# Patient Record
Sex: Female | Born: 1955 | Race: White | Hispanic: No | Marital: Married | Smoking: Former smoker
Health system: Southern US, Community
[De-identification: ages and names within clinical notes are randomized; demographics above are authoritative.]

## PROBLEM LIST (undated history)

## (undated) DIAGNOSIS — E785 Hyperlipidemia, unspecified: Secondary | ICD-10-CM

## (undated) DIAGNOSIS — E079 Disorder of thyroid, unspecified: Secondary | ICD-10-CM

## (undated) DIAGNOSIS — C801 Malignant (primary) neoplasm, unspecified: Secondary | ICD-10-CM

## (undated) DIAGNOSIS — I10 Essential (primary) hypertension: Secondary | ICD-10-CM

## (undated) HISTORY — PX: BUNIONECTOMY: SHX129

## (undated) HISTORY — PX: TUBAL LIGATION: SHX77

---

## 2005-11-02 ENCOUNTER — Ambulatory Visit: Payer: Self-pay | Admitting: Internal Medicine

## 2007-10-26 ENCOUNTER — Ambulatory Visit: Payer: Self-pay

## 2007-12-02 ENCOUNTER — Ambulatory Visit: Payer: Self-pay | Admitting: *Deleted

## 2008-01-10 ENCOUNTER — Ambulatory Visit: Payer: Self-pay | Admitting: Gastroenterology

## 2009-07-01 ENCOUNTER — Ambulatory Visit: Payer: Self-pay | Admitting: Nurse Practitioner

## 2009-12-28 ENCOUNTER — Ambulatory Visit: Payer: Self-pay | Admitting: Family Medicine

## 2010-09-27 ENCOUNTER — Ambulatory Visit: Payer: Self-pay | Admitting: Family Medicine

## 2011-10-03 ENCOUNTER — Ambulatory Visit: Payer: Self-pay | Admitting: Family Medicine

## 2012-10-03 ENCOUNTER — Ambulatory Visit: Payer: Self-pay | Admitting: Internal Medicine

## 2013-04-06 ENCOUNTER — Emergency Department: Payer: Self-pay | Admitting: Emergency Medicine

## 2013-09-22 ENCOUNTER — Ambulatory Visit: Payer: Self-pay

## 2013-09-22 LAB — RAPID STREP-A WITH REFLX: Micro Text Report: NEGATIVE

## 2013-10-07 ENCOUNTER — Ambulatory Visit: Payer: Self-pay | Admitting: Internal Medicine

## 2014-12-16 ENCOUNTER — Ambulatory Visit: Payer: Self-pay | Admitting: Internal Medicine

## 2015-10-21 ENCOUNTER — Other Ambulatory Visit: Payer: Self-pay | Admitting: Internal Medicine

## 2015-10-21 DIAGNOSIS — Z1231 Encounter for screening mammogram for malignant neoplasm of breast: Secondary | ICD-10-CM

## 2015-12-21 ENCOUNTER — Ambulatory Visit
Admission: RE | Admit: 2015-12-21 | Discharge: 2015-12-21 | Disposition: A | Discharge status: Home / Self Care | Payer: Commercial Managed Care - PPO | Source: Ambulatory Visit | Attending: Internal Medicine | Admitting: Internal Medicine

## 2015-12-21 DIAGNOSIS — Z1231 Encounter for screening mammogram for malignant neoplasm of breast: Secondary | ICD-10-CM | POA: Diagnosis not present

## 2017-01-03 ENCOUNTER — Other Ambulatory Visit: Payer: Self-pay | Admitting: Internal Medicine

## 2017-01-03 DIAGNOSIS — Z1231 Encounter for screening mammogram for malignant neoplasm of breast: Secondary | ICD-10-CM

## 2017-01-08 ENCOUNTER — Ambulatory Visit
Admission: RE | Admit: 2017-01-08 | Discharge: 2017-01-08 | Disposition: A | Discharge status: Home / Self Care | Payer: Commercial Managed Care - PPO | Source: Ambulatory Visit | Attending: Internal Medicine | Admitting: Internal Medicine

## 2017-01-08 DIAGNOSIS — Z1231 Encounter for screening mammogram for malignant neoplasm of breast: Secondary | ICD-10-CM | POA: Insufficient documentation

## 2017-12-12 ENCOUNTER — Other Ambulatory Visit: Payer: Self-pay | Admitting: Internal Medicine

## 2017-12-12 DIAGNOSIS — Z1231 Encounter for screening mammogram for malignant neoplasm of breast: Secondary | ICD-10-CM

## 2018-01-09 ENCOUNTER — Ambulatory Visit
Admission: RE | Admit: 2018-01-09 | Discharge: 2018-01-09 | Disposition: A | Discharge status: Home / Self Care | Payer: Commercial Managed Care - PPO | Source: Ambulatory Visit | Attending: Internal Medicine | Admitting: Internal Medicine

## 2018-01-09 DIAGNOSIS — Z1231 Encounter for screening mammogram for malignant neoplasm of breast: Secondary | ICD-10-CM | POA: Insufficient documentation

## 2018-01-09 HISTORY — DX: Malignant (primary) neoplasm, unspecified: C80.1

## 2018-12-25 ENCOUNTER — Other Ambulatory Visit: Payer: Self-pay | Admitting: Internal Medicine

## 2018-12-25 DIAGNOSIS — Z1231 Encounter for screening mammogram for malignant neoplasm of breast: Secondary | ICD-10-CM

## 2019-01-13 ENCOUNTER — Ambulatory Visit: Payer: Commercial Managed Care - PPO

## 2019-01-16 ENCOUNTER — Ambulatory Visit
Admission: RE | Admit: 2019-01-16 | Discharge: 2019-01-16 | Disposition: A | Discharge status: Home / Self Care | Payer: Commercial Managed Care - PPO | Source: Ambulatory Visit | Attending: Internal Medicine | Admitting: Internal Medicine

## 2019-01-16 DIAGNOSIS — Z1231 Encounter for screening mammogram for malignant neoplasm of breast: Secondary | ICD-10-CM | POA: Diagnosis not present

## 2019-12-24 ENCOUNTER — Other Ambulatory Visit: Payer: Self-pay | Admitting: Internal Medicine

## 2019-12-24 DIAGNOSIS — Z1231 Encounter for screening mammogram for malignant neoplasm of breast: Secondary | ICD-10-CM

## 2020-01-20 ENCOUNTER — Other Ambulatory Visit: Payer: Self-pay

## 2020-01-20 ENCOUNTER — Ambulatory Visit
Admission: RE | Admit: 2020-01-20 | Discharge: 2020-01-20 | Disposition: A | Discharge status: Home / Self Care | Payer: Commercial Managed Care - PPO | Source: Ambulatory Visit | Attending: Internal Medicine | Admitting: Internal Medicine

## 2020-01-20 ENCOUNTER — Ambulatory Visit: Payer: Commercial Managed Care - PPO

## 2020-01-20 DIAGNOSIS — Z1231 Encounter for screening mammogram for malignant neoplasm of breast: Secondary | ICD-10-CM | POA: Diagnosis present

## 2020-06-24 ENCOUNTER — Ambulatory Visit
Admission: EM | Admit: 2020-06-24 | Discharge: 2020-06-24 | Disposition: A | Discharge status: Home / Self Care | Payer: Commercial Managed Care - PPO

## 2020-06-24 ENCOUNTER — Other Ambulatory Visit: Payer: Self-pay

## 2020-06-24 ENCOUNTER — Ambulatory Visit (INDEPENDENT_AMBULATORY_CARE_PROVIDER_SITE_OTHER): Payer: Commercial Managed Care - PPO

## 2020-06-24 ENCOUNTER — Encounter: Payer: Self-pay | Admitting: Internal Medicine

## 2020-06-24 DIAGNOSIS — S90112A Contusion of left great toe without damage to nail, initial encounter: Secondary | ICD-10-CM | POA: Diagnosis not present

## 2020-06-24 HISTORY — DX: Disorder of thyroid, unspecified: E07.9

## 2020-06-24 HISTORY — DX: Essential (primary) hypertension: I10

## 2020-06-24 HISTORY — DX: Hyperlipidemia, unspecified: E78.5

## 2020-06-24 NOTE — ED Triage Notes (Signed)
Patient states that around 730pm last night she was walking up brick stairs and didn't lift her foot high enough and hit her toe on the step. States that pain has been constant and she is concerned for fracture.

## 2020-06-24 NOTE — Discharge Instructions (Signed)
Use ice for 48 h 15 minutes at  a time 3-4 times  per day and elevate foot when resting. Make sure as the swelling goes down you are able to flex your toe just like the other side, to make sure your tendon is normal, I you cant flex it in 7-10 days or sooner, then you need to follow up with orthopedics

## 2020-06-24 NOTE — ED Provider Notes (Signed)
[facility]    CSN: [id number] Arrival date & time: 06/24/20  0802      History   Chief Complaint Chief Complaint  Patient presents with  . Toe Pain    left big toe    HPI Theresa Kelly is a 64 y.o. female. who presents with L great toe pain. Last pm she was walking up steps on a brick steps and she hit the tip of her L great toe and she felt it jammed it in the center. She did not think much of it, but was awaken in the middle of the night with pain.     Past Medical History:  Diagnosis Date  . Cancer ([place])    basal cell  . Hyperlipidemia   . Hypertension   . Thyroid disease     There are no problems to display for this patient.   Past Surgical History:  Procedure Laterality Date  . TUBAL LIGATION      OB History   No obstetric history on file.      Home Medications    Prior to Admission medications   Medication Sig Start Date End Date Taking? Authorizing Provider  aspirin 81 MG EC tablet Take by mouth.   Yes [provider]  atorvastatin (LIPITOR) 20 MG tablet Take 20 mg by mouth daily. 04/17/20  Yes [provider]  hydrochlorothiazide (HYDRODIURIL) 25 MG tablet Take 25 mg by mouth daily. 06/01/20  Yes [provider]  SYNTHROID 100 MCG tablet Take 100 mcg by mouth daily. 05/07/20  Yes [provider]    Family History Family History  Problem Relation Age of Onset  . Breast cancer Sister        mat half sister    Social History Social History   Tobacco Use  . Smoking status: Never Smoker  . Smokeless tobacco: Never Used  Vaping Use  . Vaping Use: Never used  Substance Use Topics  . Alcohol use: Yes    Comment: wine weekly  . Drug use: Not Currently     Allergies   Patient has no known allergies.   Review of Systems Review of Systems  Musculoskeletal: Positive for arthralgias and joint swelling. Negative for gait problem.  Skin: Positive for color change. Negative for rash and  wound.  Neurological: Negative for numbness.     Physical Exam Triage Vital Signs ED Triage Vitals  Enc Vitals Group     BP 06/24/20 0814 136/81     Pulse Rate 06/24/20 0814 93     Resp 06/24/20 0814 18     Temp 06/24/20 0814 98.4 F (36.9 C)     Temp Source 06/24/20 0814 Oral     SpO2 06/24/20 0814 96 %     Weight 06/24/20 0812 200 lb (90.7 kg)     Height 06/24/20 0812 5\' 3"  (1.6 m)     Head Circumference --      Peak Flow --      Pain Score 06/24/20 0812 6     Pain Loc --      Pain Edu? --      Excl. in [place]? --    No data found.  Updated Vital Signs BP 136/81 (BP Location: Left Arm)   Pulse 93   Temp 98.4 F (36.9 C) (Oral)   Resp 18   Ht 5\' 3"  (1.6 m)   Wt 200 lb (90.7 kg)   SpO2 96%   BMI 35.43 kg/m  Visual Acuity Right Eye Distance:   Left Eye Distance:   Bilateral Distance:    Right Eye Near:   Left Eye Near:    Bilateral Near:     Physical Exam Vitals and nursing note reviewed.  Constitutional:      General: She is not in acute distress.    Appearance: She is obese. She is not toxic-appearing.  HENT:     Head: Atraumatic.     Right Ear: External ear normal.     Left Ear: External ear normal.  Eyes:     General: No scleral icterus.    Conjunctiva/sclera: Conjunctivae normal.  Pulmonary:     Effort: Pulmonary effort is normal.  Musculoskeletal:        General: No deformity.     Cervical back: Neck supple.     Right lower leg: No edema.     Left lower leg: No edema.     Comments: L FOOT- has ecchymosis on the mid and proximal joint area with swelling and tenderness. Has decreased ROM due to pain. +2/4 pedal pulses. No tenderness present on the rest of her toes and foot   Skin:    General: Skin is warm and dry.  Neurological:     Mental Status: She is alert and oriented to person, place, and time.     Sensory: No sensory deficit.     Gait: Gait normal.  Psychiatric:        Mood and Affect: Mood normal.        Behavior: Behavior normal.         Thought Content: Thought content normal.        Judgment: Judgment normal.      UC Treatments / Results  Labs (all labs ordered are listed, but only abnormal results are displayed) Labs Reviewed - No data to display  EKG   Radiology DG Toe Great Left  Result Date: 06/24/2020 CLINICAL DATA:  Stubbed toe on a brick climbing onto porch last night, bruising and pain at DIP joint region EXAM: LEFT GREAT TOE COMPARISON:  None FINDINGS: Osseous demineralization. K-wire at distal first metatarsal. Degenerative changes first MTP joint. IP joint space preserved. No definite fracture, dislocation, or bone destruction. IMPRESSION: Osseous demineralization with degenerative changes of first MTP joint and prior distal first metatarsal osteotomy. No acute osseous abnormalities. Electronically Signed   By: Lavonia Dana M.D.   On: 06/24/2020 08:37    Procedures Procedures (including critical care time)  Medications Ordered in UC Medications - No data to display  Initial Impression / Assessment and Plan / UC Course  I have reviewed the triage vital signs and the nursing notes. Has contusion of L great toe.  Pertinent  imaging results that were available during my care of the patient were reviewed by me and considered in my medical decision making (see chart for details). See instructions  For care.   Final Clinical Impressions(s) / UC Diagnoses   Final diagnoses:  Contusion of left great toe without damage to nail, initial encounter     Discharge Instructions     Use ice for 48 h 15 minutes at  a time 3-4 times  per day and elevate foot when resting. Make sure as the swelling goes down you are able to flex your toe just like the other side, to make sure your tendon is normal, I you cant flex it in 7-10 days or sooner, then you need to follow up with orthopedics  ED Prescriptions    None     PDMP not reviewed this encounter.   Shelby Mattocks, [place] 06/24/20  [phone]

## 2021-05-12 ENCOUNTER — Other Ambulatory Visit: Payer: Self-pay | Admitting: Internal Medicine

## 2021-05-12 DIAGNOSIS — Z1231 Encounter for screening mammogram for malignant neoplasm of breast: Secondary | ICD-10-CM

## 2021-05-31 IMAGING — CR DG TOE GREAT 2+V*L*
3 series · 3 of 3 positions shown · non-contrast
Comparison: None

CLINICAL DATA: Stubbed toe on a brick climbing onto porch last
night, bruising and pain at DIP joint region

EXAM:
LEFT GREAT TOE

[toe ap]
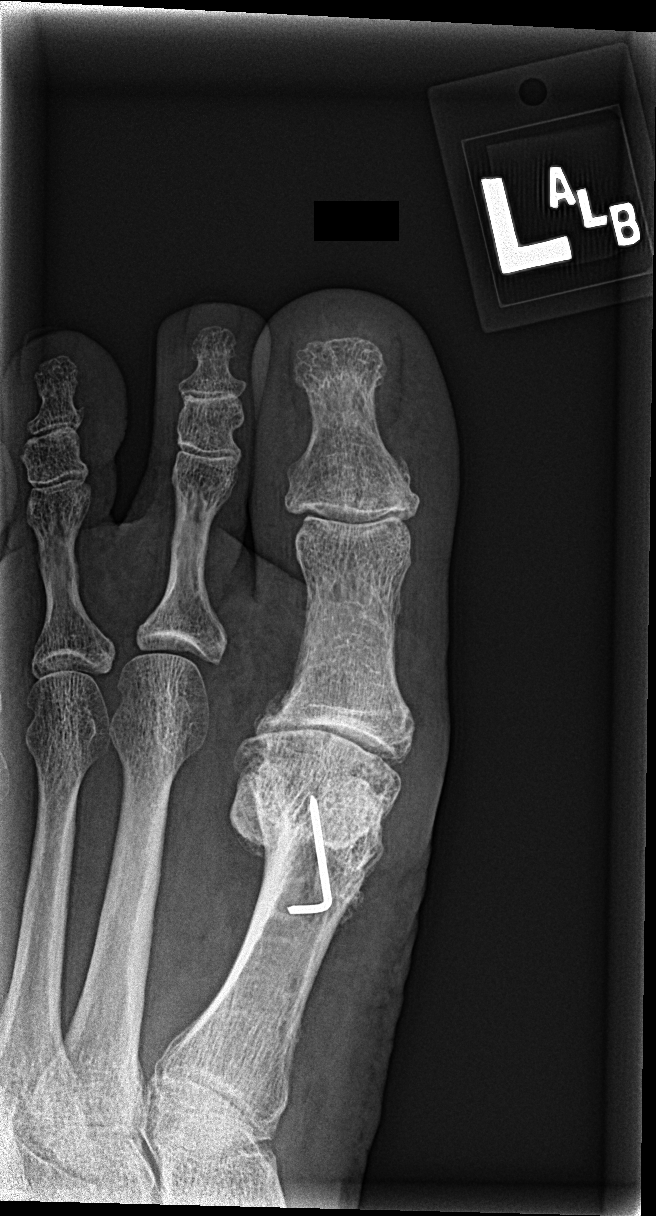

[toe obl]
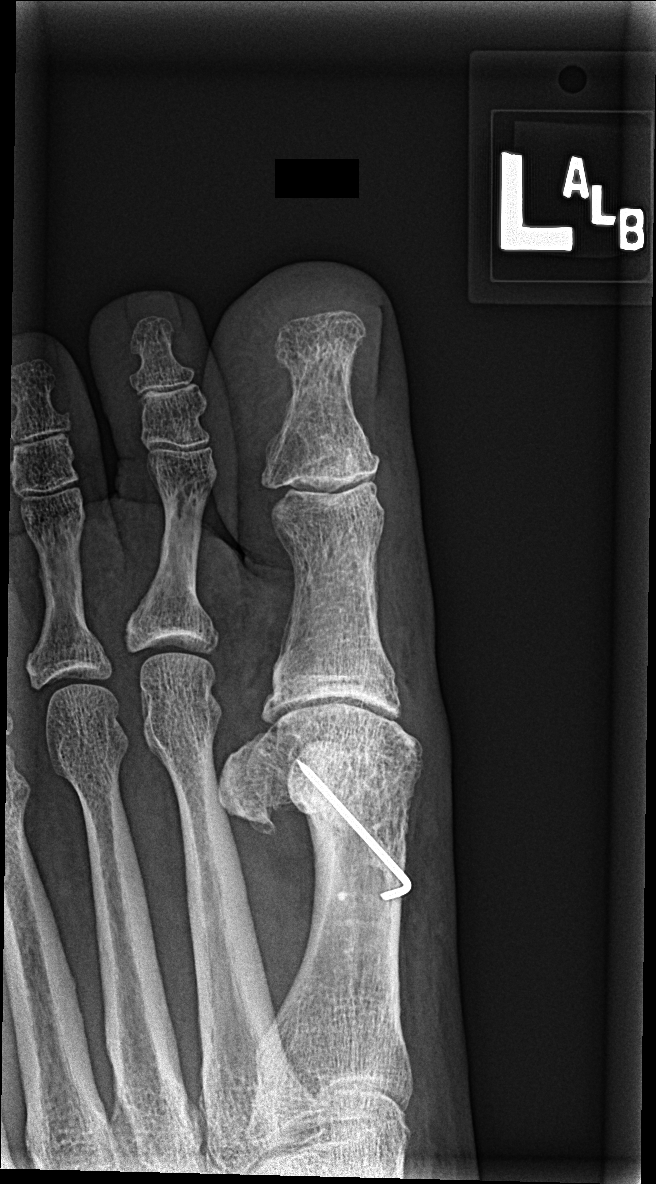

[toe lat]
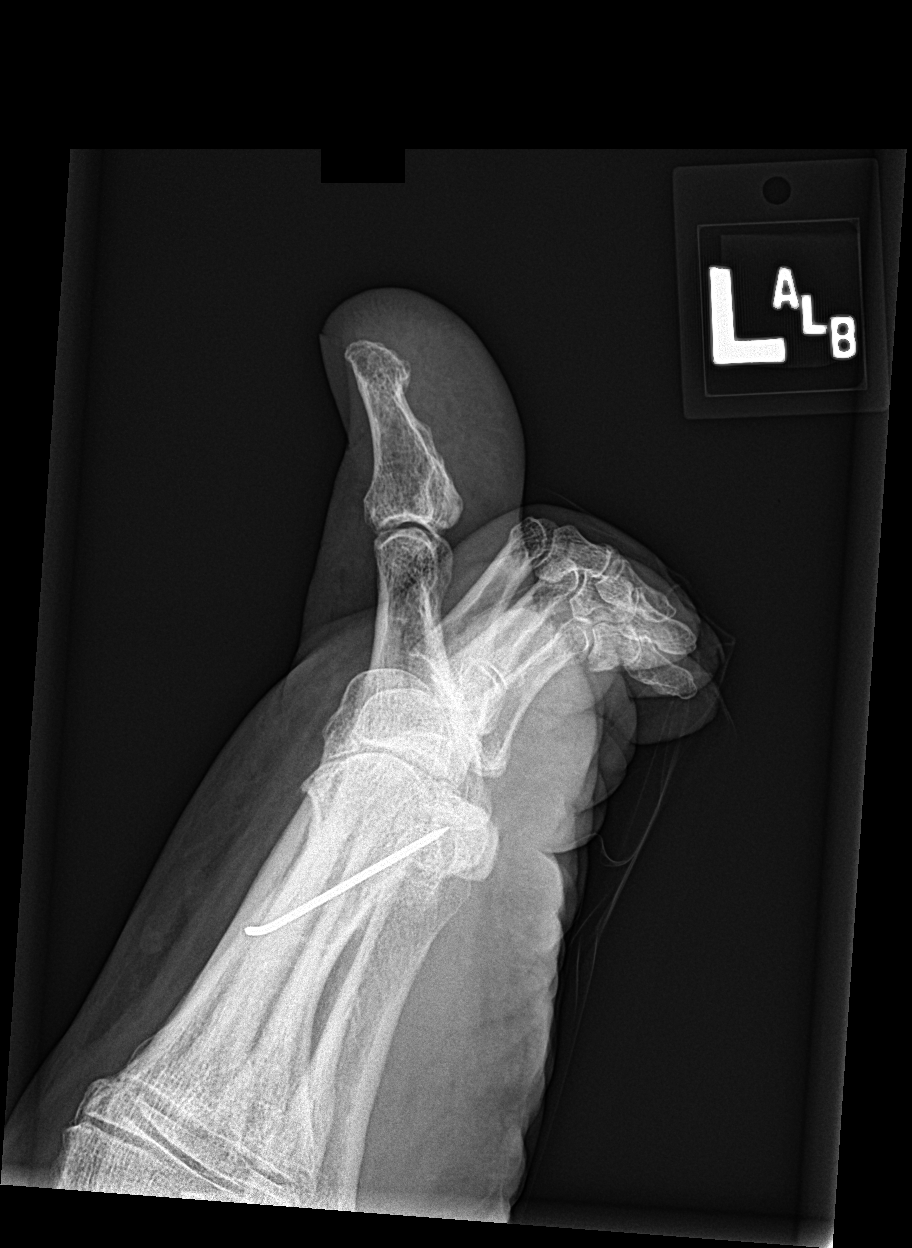

[3 of 3 positions shown; findings below may reference images not displayed]

FINDINGS: Osseous demineralization.

K-wire at distal first metatarsal.

Degenerative changes first MTP joint.

IP joint space preserved.

No definite fracture, dislocation, or bone destruction.
IMPRESSION: Osseous demineralization with degenerative changes of first MTP
joint and prior distal first metatarsal osteotomy.

No acute osseous abnormalities.

## 2021-06-22 ENCOUNTER — Ambulatory Visit
Admission: RE | Admit: 2021-06-22 | Discharge: 2021-06-22 | Disposition: A | Discharge status: Home / Self Care | Payer: Medicare Other | Source: Ambulatory Visit | Attending: Internal Medicine | Admitting: Internal Medicine

## 2021-06-22 ENCOUNTER — Other Ambulatory Visit: Payer: Self-pay

## 2021-06-22 DIAGNOSIS — Z1231 Encounter for screening mammogram for malignant neoplasm of breast: Secondary | ICD-10-CM | POA: Diagnosis present

## 2022-03-02 DIAGNOSIS — H52223 Regular astigmatism, bilateral: Secondary | ICD-10-CM | POA: Diagnosis not present

## 2022-03-02 DIAGNOSIS — H11002 Unspecified pterygium of left eye: Secondary | ICD-10-CM | POA: Diagnosis not present

## 2022-03-02 DIAGNOSIS — H524 Presbyopia: Secondary | ICD-10-CM | POA: Diagnosis not present

## 2022-03-02 DIAGNOSIS — H2513 Age-related nuclear cataract, bilateral: Secondary | ICD-10-CM | POA: Diagnosis not present

## 2022-03-02 DIAGNOSIS — H35362 Drusen (degenerative) of macula, left eye: Secondary | ICD-10-CM | POA: Diagnosis not present

## 2022-03-02 DIAGNOSIS — H5213 Myopia, bilateral: Secondary | ICD-10-CM | POA: Diagnosis not present

## 2022-05-25 DIAGNOSIS — H2513 Age-related nuclear cataract, bilateral: Secondary | ICD-10-CM | POA: Diagnosis not present

## 2022-05-31 ENCOUNTER — Other Ambulatory Visit: Payer: Self-pay | Admitting: Internal Medicine

## 2022-05-31 DIAGNOSIS — Z0000 Encounter for general adult medical examination without abnormal findings: Secondary | ICD-10-CM | POA: Diagnosis not present

## 2022-05-31 DIAGNOSIS — R739 Hyperglycemia, unspecified: Secondary | ICD-10-CM | POA: Diagnosis not present

## 2022-05-31 DIAGNOSIS — Z1231 Encounter for screening mammogram for malignant neoplasm of breast: Secondary | ICD-10-CM

## 2022-05-31 DIAGNOSIS — Z1211 Encounter for screening for malignant neoplasm of colon: Secondary | ICD-10-CM | POA: Diagnosis not present

## 2022-05-31 DIAGNOSIS — M79642 Pain in left hand: Secondary | ICD-10-CM | POA: Diagnosis not present

## 2022-05-31 DIAGNOSIS — E039 Hypothyroidism, unspecified: Secondary | ICD-10-CM | POA: Diagnosis not present

## 2022-05-31 DIAGNOSIS — E559 Vitamin D deficiency, unspecified: Secondary | ICD-10-CM | POA: Diagnosis not present

## 2022-05-31 DIAGNOSIS — I10 Essential (primary) hypertension: Secondary | ICD-10-CM | POA: Diagnosis not present

## 2022-05-31 DIAGNOSIS — E782 Mixed hyperlipidemia: Secondary | ICD-10-CM | POA: Diagnosis not present

## 2022-05-31 DIAGNOSIS — Z79899 Other long term (current) drug therapy: Secondary | ICD-10-CM | POA: Diagnosis not present

## 2022-06-19 DIAGNOSIS — M65342 Trigger finger, left ring finger: Secondary | ICD-10-CM | POA: Diagnosis not present

## 2022-06-26 ENCOUNTER — Ambulatory Visit
Admission: RE | Admit: 2022-06-26 | Discharge: 2022-06-26 | Disposition: A | Discharge status: Home / Self Care | Payer: No Typology Code available for payment source | Source: Ambulatory Visit | Attending: Internal Medicine | Admitting: Internal Medicine

## 2022-06-26 DIAGNOSIS — Z1231 Encounter for screening mammogram for malignant neoplasm of breast: Secondary | ICD-10-CM | POA: Diagnosis not present

## 2022-07-12 DIAGNOSIS — H2511 Age-related nuclear cataract, right eye: Secondary | ICD-10-CM | POA: Diagnosis not present

## 2022-07-18 ENCOUNTER — Encounter: Payer: Self-pay | Admitting: Ophthalmology

## 2022-07-18 NOTE — Anesthesia Preprocedure Evaluation (Addendum)
Anesthesia Evaluation  Patient identified by MRN, date of birth, ID band Patient awake    Reviewed: Allergy & Precautions, NPO status , Patient's Chart, lab work & pertinent test results  Airway Mallampati: III  TM Distance: >3 FB Neck ROM: full    Dental no notable dental hx.    Pulmonary former smoker,    Pulmonary exam normal        Cardiovascular hypertension, Pt. on medications Normal cardiovascular exam     Neuro/Psych negative neurological ROS  negative psych ROS   GI/Hepatic negative GI ROS, Neg liver ROS,   Endo/Other  Hypothyroidism   Renal/GU negative Renal ROS  negative genitourinary   Musculoskeletal   Abdominal (+) + obese,   Peds  Hematology negative hematology ROS (+)   Anesthesia Other Findings Past Medical History: No date: Cancer ([place])     Comment:  basal cell No date: Hyperlipidemia No date: Hypertension No date: Thyroid disease  Past Surgical History: No date: TUBAL LIGATION     Reproductive/Obstetrics negative OB ROS                            Anesthesia Physical Anesthesia Plan  ASA: 2  Anesthesia Plan: MAC   Post-op Pain Management: Minimal or no pain anticipated   Induction: Intravenous  PONV Risk Score and Plan: Treatment may vary due to age or medical condition  Airway Management Planned: Natural Airway  Additional Equipment:   Intra-op Plan:   Post-operative Plan:   Informed Consent: I have reviewed the patients History and Physical, chart, labs and discussed the procedure including the risks, benefits and alternatives for the proposed anesthesia with the patient or authorized representative who has indicated his/her understanding and acceptance.     Dental advisory given  Plan Discussed with: Anesthesiologist, CRNA and Surgeon  Anesthesia Plan Comments:        Anesthesia Quick Evaluation

## 2022-07-25 NOTE — Discharge Instructions (Signed)

## 2022-07-27 ENCOUNTER — Ambulatory Visit (AMBULATORY_SURGERY_CENTER): Payer: No Typology Code available for payment source | Admitting: Anesthesiology

## 2022-07-27 ENCOUNTER — Encounter
Admission: RE | Disposition: A | Discharge status: Home / Self Care | Payer: Self-pay | Source: Home / Self Care | Attending: Ophthalmology

## 2022-07-27 ENCOUNTER — Other Ambulatory Visit: Payer: Self-pay

## 2022-07-27 ENCOUNTER — Encounter: Payer: Self-pay | Admitting: Ophthalmology

## 2022-07-27 ENCOUNTER — Ambulatory Visit: Payer: No Typology Code available for payment source | Admitting: Anesthesiology

## 2022-07-27 ENCOUNTER — Ambulatory Visit
Admission: RE | Admit: 2022-07-27 | Discharge: 2022-07-27 | Disposition: A | Discharge status: Home / Self Care | Payer: No Typology Code available for payment source | Attending: Ophthalmology | Admitting: Ophthalmology

## 2022-07-27 DIAGNOSIS — Z87891 Personal history of nicotine dependence: Secondary | ICD-10-CM | POA: Insufficient documentation

## 2022-07-27 DIAGNOSIS — E039 Hypothyroidism, unspecified: Secondary | ICD-10-CM | POA: Diagnosis not present

## 2022-07-27 DIAGNOSIS — E669 Obesity, unspecified: Secondary | ICD-10-CM | POA: Diagnosis not present

## 2022-07-27 DIAGNOSIS — H2511 Age-related nuclear cataract, right eye: Secondary | ICD-10-CM | POA: Insufficient documentation

## 2022-07-27 DIAGNOSIS — Z6836 Body mass index (BMI) 36.0-36.9, adult: Secondary | ICD-10-CM | POA: Diagnosis not present

## 2022-07-27 DIAGNOSIS — I10 Essential (primary) hypertension: Secondary | ICD-10-CM | POA: Insufficient documentation

## 2022-07-27 HISTORY — PX: CATARACT EXTRACTION W/PHACO: SHX586

## 2022-07-27 SURGERY — PHACOEMULSIFICATION, CATARACT, WITH IOL INSERTION
Anesthesia: Monitor Anesthesia Care | Site: Eye | Laterality: Right

## 2022-07-27 MED ORDER — ARMC OPHTHALMIC DILATING DROPS
1.0000 | OPHTHALMIC | Status: DC | PRN
  Administered 2022-07-27 (×3): 1 via OPHTHALMIC

## 2022-07-27 MED ORDER — TIMOLOL MALEATE 0.5 % OP SOLN
OPHTHALMIC | Status: DC | PRN
  Administered 2022-07-27: 1 [drp] via OPHTHALMIC

## 2022-07-27 MED ORDER — MIDAZOLAM HCL 2 MG/2ML IJ SOLN
INTRAMUSCULAR | Status: DC | PRN
  Administered 2022-07-27: 1 mg via INTRAVENOUS

## 2022-07-27 MED ORDER — LIDOCAINE HCL (PF) 2 % IJ SOLN
INTRAOCULAR | Status: DC | PRN
  Administered 2022-07-27: 4 mL via INTRAOCULAR

## 2022-07-27 MED ORDER — TETRACAINE 0.5 % OP SOLN OPTIME - NO CHARGE
OPHTHALMIC | Status: DC | PRN
  Administered 2022-07-27: 1 [drp] via OPHTHALMIC

## 2022-07-27 MED ORDER — BRIMONIDINE TARTRATE 0.2 % OP SOLN
OPHTHALMIC | Status: DC | PRN
  Administered 2022-07-27: 1 [drp] via OPHTHALMIC

## 2022-07-27 MED ORDER — FENTANYL CITRATE (PF) 100 MCG/2ML IJ SOLN
INTRAMUSCULAR | Status: DC | PRN
  Administered 2022-07-27: 25 ug via INTRAVENOUS
  Administered 2022-07-27: 50 ug via INTRAVENOUS

## 2022-07-27 MED ORDER — SIGHTPATH DOSE#1 BSS IO SOLN
INTRAOCULAR | Status: DC | PRN
  Administered 2022-07-27: 15 mL via INTRAOCULAR

## 2022-07-27 MED ORDER — SIGHTPATH DOSE#1 BSS IO SOLN
INTRAOCULAR | Status: DC | PRN
  Administered 2022-07-27: 73 mL via OPHTHALMIC

## 2022-07-27 MED ORDER — TETRACAINE HCL 0.5 % OP SOLN
1.0000 [drp] | OPHTHALMIC | Status: DC | PRN
  Administered 2022-07-27 (×3): 1 [drp] via OPHTHALMIC

## 2022-07-27 MED ORDER — SIGHTPATH DOSE#1 NA HYALUR & NA CHOND-NA HYALUR IO KIT
PACK | INTRAOCULAR | Status: DC | PRN
  Administered 2022-07-27: 1 via OPHTHALMIC

## 2022-07-27 MED ORDER — MOXIFLOXACIN HCL 0.5 % OP SOLN
OPHTHALMIC | Status: DC | PRN
  Administered 2022-07-27: 0.2 mL via OPHTHALMIC

## 2022-07-27 SURGICAL SUPPLY — 23 items
CANNULA ANT/CHMB 27G (MISCELLANEOUS) IMPLANT
CANNULA ANT/CHMB 27GA (MISCELLANEOUS) IMPLANT
CATARACT SUITE SIGHTPATH (MISCELLANEOUS) ×1 IMPLANT
DISSECTOR HYDRO NUCLEUS 50X22 (MISCELLANEOUS) ×1 IMPLANT
DRSG TEGADERM 2-3/8X2-3/4 SM (GAUZE/BANDAGES/DRESSINGS) ×1 IMPLANT
FEE CATARACT SUITE SIGHTPATH (MISCELLANEOUS) ×1 IMPLANT
GLOVE SURG SYN 7.5  E (GLOVE) ×1
GLOVE SURG SYN 7.5 E (GLOVE) ×1 IMPLANT
GLOVE SURG SYN 7.5 PF PI (GLOVE) ×1 IMPLANT
GLOVE SURG SYN 8.5  E (GLOVE) ×1
GLOVE SURG SYN 8.5 E (GLOVE) ×1 IMPLANT
GLOVE SURG SYN 8.5 PF PI (GLOVE) ×1 IMPLANT
NDL FILTER BLUNT 18X1 1/2 (NEEDLE) IMPLANT
NEEDLE FILTER BLUNT 18X 1/2SAF (NEEDLE)
NEEDLE FILTER BLUNT 18X1 1/2 (NEEDLE) IMPLANT
PACK VIT ANT 23G (MISCELLANEOUS) IMPLANT
RING MALYGIN (MISCELLANEOUS) IMPLANT
SUT ETHILON 10-0 CS-B-6CS-B-6 (SUTURE)
SUTURE EHLN 10-0 CS-B-6CS-B-6 (SUTURE) IMPLANT
SYR 3ML LL SCALE MARK (SYRINGE) IMPLANT
SYR 5ML LL (SYRINGE) IMPLANT
WATER STERILE IRR 250ML POUR (IV SOLUTION) ×1 IMPLANT
enVista Toric MX60ET 18.5 D (Intraocular Lens) IMPLANT

## 2022-07-27 NOTE — Transfer of Care (Signed)
Immediate Anesthesia Transfer of Care Note  Patient: [place]  Procedure(s) Performed: CATARACT EXTRACTION PHACO AND INTRAOCULAR LENS PLACEMENT (IOC) RIGHT envista toric (Right: Eye)  Patient Location: PACU  Anesthesia Type: MAC  Level of Consciousness: awake, alert  and patient cooperative  Airway and Oxygen Therapy: Patient Spontanous Breathing and Patient connected to supplemental oxygen  Post-op Assessment: Post-op Vital signs reviewed, Patient's Cardiovascular Status Stable, Respiratory Function Stable, Patent Airway and No signs of Nausea or vomiting  Post-op Vital Signs: Reviewed and stable  Complications: No notable events documented.

## 2022-07-27 NOTE — Op Note (Signed)
OPERATIVE NOTE  Theresa Kelly [id number] 07/27/2022   PREOPERATIVE DIAGNOSIS: Nuclear sclerotic cataract right eye. H25.11   POSTOPERATIVE DIAGNOSIS: Nuclear sclerotic cataract right eye. H25.11   PROCEDURE:  Phacoemusification with Toric posterior chamber intraocular lens placement of the right eye  Ultrasound time: Procedure(s) with comments: CATARACT EXTRACTION PHACO AND INTRAOCULAR LENS PLACEMENT (IOC) RIGHT envista toric (Right) - 5.56 1:04.6  LENS:   Implant Name Type Inv. Item Serial No. Manufacturer Lot No. LRB No. Used Action  enVista Toric [id number] 18.5 D Intraocular Lens  [id number]   Right 1 Implanted    [id number] Toric intraocular lens with 5.75 diopters of cylindrical power with axis orientation at 180 degrees.  SURGEON:  Courtney Heys. Lazarus Salines, MD   ANESTHESIA:  Topical with tetracaine drops, augmented with 1% preservative-free intracameral lidocaine.   COMPLICATIONS:  None.   DESCRIPTION OF PROCEDURE:  The patient was identified in the holding room and transported to the operating room and placed in the supine position under the operating microscope.  The right eye was identified as the operative eye, which was prepped and draped in the usual sterile ophthalmic fashion.   A 1 millimeter clear-corneal paracentesis was made superotemporally. Preservative-free 1% lidocaine mixed with 1:1,000 bisulfite-free aqueous solution of epinephrine was injected into the anterior chamber. The anterior chamber was then filled with Viscoat viscoelastic. A 2.4 millimeter keratome was used to make a clear-corneal incision inferotemporally. A curvilinear capsulorrhexis was made with a cystotome and capsulorrhexis forceps. Balanced salt solution was used to hydrodissect and hydrodelineate the nucleus. Phacoemulsification was then used to remove the lens nucleus and epinucleus. The remaining cortex was then removed using the irrigation and aspiration handpiece. Provisc was then placed into the  capsular bag to distend it for lens placement. The Verion digital marker was used to align the implant at the intended axis.  A +18.50 D MX60ET 5.75 Toric lens was then injected into the capsular bag.  It was rotated clockwise until the axis marks on the lens were approximately 15 degrees in the counterclockwise direction to the intended alignment.  The viscoelastic was aspirated from the eye using the irrigation aspiration handpiece.  Then, a blunt chopper through the sideport incision was used to rotate the lens in a clockwise direction until the axis markings of the intraocular lens were lined up with the Verion alignment at 000/180 degrees.  Balanced salt solution was then used to hydrate the wounds.   The anterior chamber was inflated to a physiologic pressure with balanced salt solution.  No wound leaks were noted. Vigamox was injected intracamerally.  Timolol and Brimonidine drops were applied to the eye.  The patient was taken to the recovery room in stable condition without complications of anesthesia or surgery.  [name] 07/27/2022, 9:49 AM

## 2022-07-27 NOTE — H&P (Signed)
[facility]   Primary Care Physician:  Idelle Crouch, MD Ophthalmologist: Dr. Merleen Nicely  Pre-Procedure History & Physical: HPI:  Theresa Kelly is a 66 y.o. female here for cataract surgery.   Past Medical History:  Diagnosis Date   Cancer ([place])    basal cell   Hyperlipidemia    Hypertension    Thyroid disease     Past Surgical History:  Procedure Laterality Date   BUNIONECTOMY     TUBAL LIGATION      Prior to Admission medications   Medication Sig Start Date End Date Taking? Authorizing Provider  Ascorbic Acid (VITAMIN C) 1000 MG tablet Take 1,000 mg by mouth in the morning and at bedtime.   Yes [provider]  aspirin 81 MG EC tablet Take by mouth.   Yes [provider]  aspirin-acetaminophen-caffeine (EXCEDRIN MIGRAINE) [id number] MG tablet Take 1 tablet by mouth every 6 (six) hours as needed for headache.   Yes [provider]  atorvastatin (LIPITOR) 20 MG tablet Take 20 mg by mouth daily. 04/17/20  Yes [provider]  calcium carbonate (OS-CAL - DOSED IN MG OF ELEMENTAL CALCIUM) 1250 (500 Ca) MG tablet Take 1 tablet by mouth 2 (two) times daily with a meal.   Yes [provider]  Carboxymethylcellulose Sodium (REFRESH PLUS OP) Apply 1 drop to eye in the morning, at noon, in the evening, and at bedtime.   Yes [provider]  Cholecalciferol (VITAMIN D3) 10 MCG (400 UNIT) CAPS Take 250 mcg by mouth in the morning and at bedtime.   Yes [provider]  cyanocobalamin (VITAMIN B12) 500 MCG tablet Take 500 mcg by mouth in the morning and at bedtime.   Yes [provider]  Ferrous Sulfate (IRON) 28 MG TABS Take 65 mg by mouth daily.   Yes [provider]  hydrochlorothiazide (HYDRODIURIL) 25 MG tablet Take 25 mg by mouth daily. 06/01/20  Yes [provider]  LYSINE PO Take 2,000 mg by mouth in the morning and at bedtime.   Yes [provider]  Melatonin 12 MG  TBDP Take 12 mg by mouth as needed.   Yes [provider]  Multiple Vitamins-Minerals (KP WOMENS 50+ DAILY FORMULA) TABS Take 1 tablet by mouth daily.   Yes [provider]  SYNTHROID 100 MCG tablet Take 100 mcg by mouth daily. 05/07/20  Yes [provider]  valACYclovir (VALTREX) 1000 MG tablet Take 1,000 mg by mouth as needed.   Yes [provider]  Zinc Sulfate (ZINC 15 PO) Take 50 mg by mouth in the morning and at bedtime.   Yes [provider]    Allergies as of 05/29/2022   (No Known Allergies)    Family History  Problem Relation Age of Onset   Breast cancer Sister        mat half sister    Social History   Socioeconomic History   Marital status: Married    Spouse name: Not on file   Number of children: Not on file   Years of education: Not on file   Highest education level: Not on file  Occupational History   Not on file  Tobacco Use   Smoking status: Former    Types: Cigarettes   Smokeless tobacco: Never  Vaping Use   Vaping Use: Never used  Substance and Sexual Activity   Alcohol use: Yes    Comment: wine weekly   Drug use: Not Currently  Sexual activity: Not on file  Other Topics Concern   Not on file  Social History Narrative   Not on file   Social Determinants of Health   Financial Resource Strain: Not on file  Food Insecurity: Not on file  Transportation Needs: Not on file  Physical Activity: Not on file  Stress: Not on file  Social Connections: Not on file  Intimate Partner Violence: Not on file    Review of Systems: See HPI, otherwise negative ROS  Physical Exam: BP 138/74   Pulse 82   Temp (!) 97.3 F (36.3 C) (Temporal)   Resp 16   Ht '5\' 3"'$  (1.6 m)   Wt 93.6 kg   SpO2 96%   BMI 36.54 kg/m  General:   Alert, cooperative in NAD Head:  Normocephalic and atraumatic. Respiratory:  Normal work of breathing. Cardiovascular:  RRR  Impression/Plan: Theresa Kelly is here for cataract  surgery.  Risks, benefits, limitations, and alternatives regarding cataract surgery have been reviewed with the patient.  Questions have been answered.  All parties agreeable.   Norvel Richards, MD  07/27/2022, 8:17 AM

## 2022-07-27 NOTE — Anesthesia Postprocedure Evaluation (Signed)
Anesthesia Post Note  Patient: [place]  Procedure(s) Performed: CATARACT EXTRACTION PHACO AND INTRAOCULAR LENS PLACEMENT (IOC) RIGHT envista toric (Right: Eye)     Patient location during evaluation: PACU Anesthesia Type: MAC Level of consciousness: awake and alert Pain management: pain level controlled Vital Signs Assessment: post-procedure vital signs reviewed and stable Respiratory status: spontaneous breathing, nonlabored ventilation and respiratory function stable Cardiovascular status: blood pressure returned to baseline and stable Postop Assessment: no apparent nausea or vomiting Anesthetic complications: no   No notable events documented.  [place] Ouch

## 2022-07-28 ENCOUNTER — Encounter: Payer: Self-pay | Admitting: Ophthalmology

## 2022-08-08 DIAGNOSIS — H2512 Age-related nuclear cataract, left eye: Secondary | ICD-10-CM | POA: Diagnosis not present

## 2022-08-08 NOTE — Discharge Instructions (Signed)

## 2022-08-10 ENCOUNTER — Encounter
Admission: RE | Disposition: A | Discharge status: Home / Self Care | Payer: Self-pay | Source: Home / Self Care | Attending: Ophthalmology

## 2022-08-10 ENCOUNTER — Other Ambulatory Visit: Payer: Self-pay

## 2022-08-10 ENCOUNTER — Encounter: Payer: Self-pay | Admitting: Ophthalmology

## 2022-08-10 ENCOUNTER — Ambulatory Visit
Admission: RE | Admit: 2022-08-10 | Discharge: 2022-08-10 | Disposition: A | Discharge status: Home / Self Care | Payer: No Typology Code available for payment source | Attending: Ophthalmology | Admitting: Ophthalmology

## 2022-08-10 ENCOUNTER — Ambulatory Visit: Payer: No Typology Code available for payment source | Admitting: General Practice

## 2022-08-10 DIAGNOSIS — I10 Essential (primary) hypertension: Secondary | ICD-10-CM | POA: Insufficient documentation

## 2022-08-10 DIAGNOSIS — H2512 Age-related nuclear cataract, left eye: Secondary | ICD-10-CM | POA: Insufficient documentation

## 2022-08-10 DIAGNOSIS — Z87891 Personal history of nicotine dependence: Secondary | ICD-10-CM | POA: Insufficient documentation

## 2022-08-10 DIAGNOSIS — E119 Type 2 diabetes mellitus without complications: Secondary | ICD-10-CM | POA: Diagnosis not present

## 2022-08-10 HISTORY — PX: CATARACT EXTRACTION W/PHACO: SHX586

## 2022-08-10 SURGERY — PHACOEMULSIFICATION, CATARACT, WITH IOL INSERTION
Anesthesia: Monitor Anesthesia Care | Site: Eye | Laterality: Left

## 2022-08-10 MED ORDER — SIGHTPATH DOSE#1 BSS IO SOLN
INTRAOCULAR | Status: DC | PRN
  Administered 2022-08-10: 98 mL via OPHTHALMIC

## 2022-08-10 MED ORDER — MOXIFLOXACIN HCL 0.5 % OP SOLN
OPHTHALMIC | Status: DC | PRN
  Administered 2022-08-10: 0.2 mL via OPHTHALMIC

## 2022-08-10 MED ORDER — LIDOCAINE HCL (PF) 2 % IJ SOLN
INTRAOCULAR | Status: DC | PRN
  Administered 2022-08-10: 1 mL via INTRAOCULAR

## 2022-08-10 MED ORDER — BRIMONIDINE TARTRATE-TIMOLOL 0.2-0.5 % OP SOLN
OPHTHALMIC | Status: DC | PRN
  Administered 2022-08-10: 1 [drp] via OPHTHALMIC

## 2022-08-10 MED ORDER — MIDAZOLAM HCL 2 MG/2ML IJ SOLN
INTRAMUSCULAR | Status: DC | PRN
  Administered 2022-08-10: 2 mg via INTRAVENOUS

## 2022-08-10 MED ORDER — ARMC OPHTHALMIC DILATING DROPS
1.0000 | OPHTHALMIC | Status: DC | PRN
  Administered 2022-08-10 (×3): 1 via OPHTHALMIC

## 2022-08-10 MED ORDER — ONDANSETRON HCL 4 MG/2ML IJ SOLN
4.0000 mg | Freq: Once | INTRAMUSCULAR | Status: DC | PRN
Start: 2022-08-10 — End: 2022-08-10

## 2022-08-10 MED ORDER — SIGHTPATH DOSE#1 BSS IO SOLN
INTRAOCULAR | Status: DC | PRN
  Administered 2022-08-10: 15 mL

## 2022-08-10 MED ORDER — LACTATED RINGERS IV SOLN: INTRAVENOUS | Status: DC

## 2022-08-10 MED ORDER — SIGHTPATH DOSE#1 NA HYALUR & NA CHOND-NA HYALUR IO KIT
PACK | INTRAOCULAR | Status: DC | PRN
  Administered 2022-08-10: 1 via OPHTHALMIC

## 2022-08-10 MED ORDER — TETRACAINE HCL 0.5 % OP SOLN
1.0000 [drp] | OPHTHALMIC | Status: DC | PRN
  Administered 2022-08-10 (×3): 1 [drp] via OPHTHALMIC

## 2022-08-10 MED ORDER — FENTANYL CITRATE PF 50 MCG/ML IJ SOSY: 25.0000 ug | PREFILLED_SYRINGE | INTRAMUSCULAR | Status: DC | PRN

## 2022-08-10 SURGICAL SUPPLY — 24 items
BLADE SCLEROTME MULTI-SIDE (MISCELLANEOUS) IMPLANT
CANNULA ANT/CHMB 27G (MISCELLANEOUS) IMPLANT
CANNULA ANT/CHMB 27GA (MISCELLANEOUS) IMPLANT
CATARACT SUITE SIGHTPATH (MISCELLANEOUS) ×1 IMPLANT
DISSECTOR HYDRO NUCLEUS 50X22 (MISCELLANEOUS) ×1 IMPLANT
DRSG TEGADERM 2-3/8X2-3/4 SM (GAUZE/BANDAGES/DRESSINGS) ×1 IMPLANT
FEE CATARACT SUITE SIGHTPATH (MISCELLANEOUS) ×1 IMPLANT
GLOVE SURG SYN 7.5  E (GLOVE) ×1
GLOVE SURG SYN 7.5 E (GLOVE) ×1 IMPLANT
GLOVE SURG SYN 7.5 PF PI (GLOVE) ×1 IMPLANT
GLOVE SURG SYN 8.5  E (GLOVE) ×1
GLOVE SURG SYN 8.5 E (GLOVE) ×1 IMPLANT
GLOVE SURG SYN 8.5 PF PI (GLOVE) ×1 IMPLANT
LENS IOL TECNIS EYHANCE 16.5 (Intraocular Lens) IMPLANT
NDL FILTER BLUNT 18X1 1/2 (NEEDLE) IMPLANT
NEEDLE FILTER BLUNT 18X 1/2SAF (NEEDLE)
NEEDLE FILTER BLUNT 18X1 1/2 (NEEDLE) IMPLANT
PACK VIT ANT 23G (MISCELLANEOUS) IMPLANT
RING MALYGIN (MISCELLANEOUS) IMPLANT
SUT ETHILON 10-0 CS-B-6CS-B-6 (SUTURE)
SUTURE EHLN 10-0 CS-B-6CS-B-6 (SUTURE) IMPLANT
SYR 3ML LL SCALE MARK (SYRINGE) IMPLANT
SYR 5ML LL (SYRINGE) IMPLANT
WATER STERILE IRR 250ML POUR (IV SOLUTION) ×1 IMPLANT

## 2022-08-10 NOTE — Transfer of Care (Signed)
Immediate Anesthesia Transfer of Care Note  Patient: [place]  Procedure(s) Performed: CATARACT EXTRACTION PHACO AND INTRAOCULAR LENS PLACEMENT (IOC) LEFT WITH REMOVAL OF CORNEAL LEISION (Left: Eye)  Patient Location: PACU  Anesthesia Type: MAC  Level of Consciousness: awake, alert  and patient cooperative  Airway and Oxygen Therapy: Patient Spontanous Breathing and Patient connected to supplemental oxygen  Post-op Assessment: Post-op Vital signs reviewed, Patient's Cardiovascular Status Stable, Respiratory Function Stable, Patent Airway and No signs of Nausea or vomiting  Post-op Vital Signs: Reviewed and stable  Complications: No notable events documented.

## 2022-08-10 NOTE — Op Note (Addendum)
OPERATIVE NOTE  Theresa Kelly [id number] 08/10/2022   PREOPERATIVE DIAGNOSIS: Nuclear sclerotic cataract left eye. H25.12   POSTOPERATIVE DIAGNOSIS: Nuclear sclerotic cataract left eye. H25.12   PROCEDURE:  Phacoemusification with posterior chamber intraocular lens placement of the left eye  Ultrasound time: Procedure(s) with comments: CATARACT EXTRACTION PHACO AND INTRAOCULAR LENS PLACEMENT (IOC) LEFT (Left) - 7.83 1:40.6  LENS:   Implant Name Type Inv. Item Serial No. Manufacturer Lot No. LRB No. Used Action  LENS IOL TECNIS EYHANCE 16.5 - [id number] Intraocular Lens LENS IOL TECNIS EYHANCE 16.5 [id number] [employer]  Left 1 Implanted      SURGEON:  Courtney Heys. Lazarus Salines, MD   ANESTHESIA:  Topical with tetracaine drops, augmented with 1% preservative-free intracameral lidocaine.   COMPLICATIONS:  None.   DESCRIPTION OF PROCEDURE:  The patient was identified in the holding room and transported to the operating room and placed in the supine position under the operating microscope.  The left eye was identified as the operative eye, which was prepped and draped in the usual sterile ophthalmic fashion.   As previously discussed with patient, there was a possibility that the pterygium head would prevent adequate visualization during cataract surgery, which proved true. Before starting the cataract surgery, the pterygium head was peeled off the nasal corneal surface using .12 forceps, Vannas scissors and a .57 beaver blade. A 1 millimeter clear-corneal paracentesis was made inferotemporally. Preservative-free 1% lidocaine mixed with 1:1,000 bisulfite-free aqueous solution of epinephrine was injected into the anterior chamber. The anterior chamber was then filled with Viscoat viscoelastic. A 2.4 millimeter keratome was used to make a clear-corneal incision superotemporally. A curvilinear capsulorrhexis was made with a cystotome and capsulorrhexis forceps. Balanced salt solution was used to  hydrodissect and hydrodelineate the nucleus. Phacoemulsification was then used to remove the lens nucleus and epinucleus. The remaining cortex was then removed using the irrigation and aspiration handpiece. Provisc was then placed into the capsular bag to distend it for lens placement. A +16.50 D [id number] intraocular lens was then injected into the capsular bag. The remaining viscoelastic was aspirated.   Wounds were hydrated with balanced salt solution.  The anterior chamber was inflated to a physiologic pressure with balanced salt solution.  No wound leaks were noted. Vigamox was injected intracamerally.  Timolol and Brimonidine drops were applied to the eye.  The patient was taken to the recovery room in stable condition without complications of anesthesia or surgery.  Theresa Kelly 08/10/2022, 1:51 PM

## 2022-08-10 NOTE — Anesthesia Preprocedure Evaluation (Signed)
Anesthesia Evaluation  Patient identified by MRN, date of birth, ID band Patient awake    Reviewed: Allergy & Precautions, H&P , NPO status , Patient's Chart, lab work & pertinent test results, reviewed documented beta blocker date and time   Airway Mallampati: II  TM Distance: >3 FB Neck ROM: full    Dental no notable dental hx. (+) Teeth Intact   Pulmonary neg pulmonary ROS, former smoker,    Pulmonary exam normal breath sounds clear to auscultation       Cardiovascular Exercise Tolerance: Good hypertension, On Medications negative cardio ROS   Rhythm:regular Rate:Normal     Neuro/Psych negative neurological ROS  negative psych ROS   GI/Hepatic negative GI ROS, Neg liver ROS,   Endo/Other  negative endocrine ROSdiabetes, Well Controlled  Renal/GU      Musculoskeletal   Abdominal   Peds  Hematology negative hematology ROS (+)   Anesthesia Other Findings   Reproductive/Obstetrics negative OB ROS                             Anesthesia Physical Anesthesia Plan  ASA: 2  Anesthesia Plan: MAC   Post-op Pain Management:    Induction:   PONV Risk Score and Plan:   Airway Management Planned:   Additional Equipment:   Intra-op Plan:   Post-operative Plan:   Informed Consent: I have reviewed the patients History and Physical, chart, labs and discussed the procedure including the risks, benefits and alternatives for the proposed anesthesia with the patient or authorized representative who has indicated his/her understanding and acceptance.       Plan Discussed with: CRNA  Anesthesia Plan Comments:         Anesthesia Quick Evaluation

## 2022-08-10 NOTE — H&P (Signed)
[facility]   Primary Care Physician:  Idelle Crouch, MD Ophthalmologist: Dr. Merleen Nicely  Pre-Procedure History & Physical: HPI:  Theresa Kelly is a 66 y.o. female here for cataract surgery.   Past Medical History:  Diagnosis Date   Cancer ([place])    basal cell   Hyperlipidemia    Hypertension    Thyroid disease     Past Surgical History:  Procedure Laterality Date   BUNIONECTOMY     CATARACT EXTRACTION W/PHACO Right 07/27/2022   Procedure: CATARACT EXTRACTION PHACO AND INTRAOCULAR LENS PLACEMENT ([place]) RIGHT envista toric;  Surgeon: Norvel Richards, MD;  Location: [facility];  Service: Ophthalmology;  Laterality: Right;  5.56 1:04.6   TUBAL LIGATION      Prior to Admission medications   Medication Sig Start Date End Date Taking? Authorizing Provider  Ascorbic Acid (VITAMIN C) 1000 MG tablet Take 1,000 mg by mouth in the morning and at bedtime.   Yes [provider]  aspirin 81 MG EC tablet Take by mouth.   Yes [provider]  aspirin-acetaminophen-caffeine (EXCEDRIN MIGRAINE) [phone] MG tablet Take 1 tablet by mouth every 6 (six) hours as needed for headache.   Yes [provider]  atorvastatin (LIPITOR) 20 MG tablet Take 20 mg by mouth daily. 04/17/20  Yes [provider]  calcium carbonate (OS-CAL - DOSED IN MG OF ELEMENTAL CALCIUM) 1250 (500 Ca) MG tablet Take 1 tablet by mouth 2 (two) times daily with a meal.   Yes [provider]  Carboxymethylcellulose Sodium (REFRESH PLUS OP) Apply 1 drop to eye in the morning, at noon, in the evening, and at bedtime.   Yes [provider]  Cholecalciferol (VITAMIN D3) 10 MCG (400 UNIT) CAPS Take 250 mcg by mouth in the morning and at bedtime.   Yes [provider]  cyanocobalamin (VITAMIN B12) 500 MCG tablet Take 500 mcg by mouth in the morning and at bedtime.   Yes [provider]  Ferrous Sulfate (IRON) 28 MG TABS Take 65 mg by  mouth daily.   Yes [provider]  hydrochlorothiazide (HYDRODIURIL) 25 MG tablet Take 25 mg by mouth daily. 06/01/20  Yes [provider]  LYSINE PO Take 2,000 mg by mouth in the morning and at bedtime.   Yes [provider]  Melatonin 12 MG TBDP Take 12 mg by mouth as needed.   Yes [provider]  Multiple Vitamins-Minerals (KP WOMENS 50+ DAILY FORMULA) TABS Take 1 tablet by mouth daily.   Yes [provider]  SYNTHROID 100 MCG tablet Take 100 mcg by mouth daily. 05/07/20  Yes [provider]  valACYclovir (VALTREX) 1000 MG tablet Take 1,000 mg by mouth as needed.   Yes [provider]  Zinc Sulfate (ZINC 15 PO) Take 50 mg by mouth in the morning and at bedtime.   Yes [provider]    Allergies as of 05/29/2022   (No Known Allergies)    Family History  Problem Relation Age of Onset   Breast cancer Sister        mat half sister    Social History   Socioeconomic History   Marital status: Married    Spouse name: Not on file   Number of children: Not on file   Years of education: Not on file   Highest education level: Not on file  Occupational History   Not on file  Tobacco Use   Smoking status: Former  Types: Cigarettes   Smokeless tobacco: Never  Vaping Use   Vaping Use: Never used  Substance and Sexual Activity   Alcohol use: Yes    Comment: wine weekly   Drug use: Not Currently   Sexual activity: Not on file  Other Topics Concern   Not on file  Social History Narrative   Not on file   Social Determinants of Health   Financial Resource Strain: Not on file  Food Insecurity: Not on file  Transportation Needs: Not on file  Physical Activity: Not on file  Stress: Not on file  Social Connections: Not on file  Intimate Partner Violence: Not on file    Review of Systems: See HPI, otherwise negative ROS  Physical Exam: BP (!) 150/78   Pulse 75   Temp 97.9 F (36.6 C) (Temporal)   Resp  16   Wt 95.2 kg   SpO2 96%   BMI 37.18 kg/m  General:   Alert, cooperative in NAD Head:  Normocephalic and atraumatic. Respiratory:  Normal work of breathing. Cardiovascular:  RRR  Impression/Plan: Theresa Kelly is here for cataract surgery.  Risks, benefits, limitations, and alternatives regarding cataract surgery have been reviewed with the patient.  Questions have been answered.  All parties agreeable.   Norvel Richards, MD  08/10/2022, 12:00 PM

## 2022-08-11 ENCOUNTER — Encounter: Payer: Self-pay | Admitting: Ophthalmology

## 2022-08-14 NOTE — Anesthesia Postprocedure Evaluation (Signed)
Anesthesia Post Note  Patient: [place]  Procedure(s) Performed: CATARACT EXTRACTION PHACO AND INTRAOCULAR LENS PLACEMENT ([facility]) LEFT WITH REMOVAL OF CORNEAL LEISION (Left: Eye)  Patient location during evaluation: PACU Anesthesia Type: MAC Level of consciousness: awake and alert Pain management: pain level controlled Vital Signs Assessment: post-procedure vital signs reviewed and stable Respiratory status: spontaneous breathing, nonlabored ventilation, respiratory function stable and patient connected to nasal cannula oxygen Cardiovascular status: stable and blood pressure returned to baseline Postop Assessment: no apparent nausea or vomiting Anesthetic complications: no   No notable events documented.   Last Vitals:  Vitals:   08/10/22 1356 08/10/22 1357  BP:  (!) 153/75  Pulse: 71 72  Resp: 16 (!) 23  Temp:    SpO2: 97% 97%    Last Pain:  Vitals:   08/11/22 1237  TempSrc:   PainSc: 0-No pain                 Molli Barrows

## 2022-09-04 DIAGNOSIS — Z0100 Encounter for examination of eyes and vision without abnormal findings: Secondary | ICD-10-CM | POA: Diagnosis not present

## 2022-12-06 DIAGNOSIS — E559 Vitamin D deficiency, unspecified: Secondary | ICD-10-CM | POA: Diagnosis not present

## 2022-12-06 DIAGNOSIS — E782 Mixed hyperlipidemia: Secondary | ICD-10-CM | POA: Diagnosis not present

## 2022-12-06 DIAGNOSIS — E039 Hypothyroidism, unspecified: Secondary | ICD-10-CM | POA: Diagnosis not present

## 2022-12-06 DIAGNOSIS — Z79899 Other long term (current) drug therapy: Secondary | ICD-10-CM | POA: Diagnosis not present

## 2022-12-06 DIAGNOSIS — I10 Essential (primary) hypertension: Secondary | ICD-10-CM | POA: Diagnosis not present

## 2022-12-06 DIAGNOSIS — Z0000 Encounter for general adult medical examination without abnormal findings: Secondary | ICD-10-CM | POA: Diagnosis not present

## 2022-12-06 DIAGNOSIS — R739 Hyperglycemia, unspecified: Secondary | ICD-10-CM | POA: Diagnosis not present

## 2022-12-06 DIAGNOSIS — Z1211 Encounter for screening for malignant neoplasm of colon: Secondary | ICD-10-CM | POA: Diagnosis not present

## 2022-12-06 DIAGNOSIS — R7303 Prediabetes: Secondary | ICD-10-CM | POA: Diagnosis not present

## 2023-01-12 DIAGNOSIS — M3501 Sicca syndrome with keratoconjunctivitis: Secondary | ICD-10-CM | POA: Diagnosis not present

## 2023-03-27 DIAGNOSIS — L400 Psoriasis vulgaris: Secondary | ICD-10-CM | POA: Diagnosis not present

## 2023-03-27 DIAGNOSIS — D2272 Melanocytic nevi of left lower limb, including hip: Secondary | ICD-10-CM | POA: Diagnosis not present

## 2023-03-27 DIAGNOSIS — L820 Inflamed seborrheic keratosis: Secondary | ICD-10-CM | POA: Diagnosis not present

## 2023-03-27 DIAGNOSIS — D225 Melanocytic nevi of trunk: Secondary | ICD-10-CM | POA: Diagnosis not present

## 2023-03-27 DIAGNOSIS — D2261 Melanocytic nevi of right upper limb, including shoulder: Secondary | ICD-10-CM | POA: Diagnosis not present

## 2023-03-27 DIAGNOSIS — L570 Actinic keratosis: Secondary | ICD-10-CM | POA: Diagnosis not present

## 2023-03-27 DIAGNOSIS — D485 Neoplasm of uncertain behavior of skin: Secondary | ICD-10-CM | POA: Diagnosis not present

## 2023-06-15 DIAGNOSIS — I10 Essential (primary) hypertension: Secondary | ICD-10-CM | POA: Diagnosis not present

## 2023-06-15 DIAGNOSIS — E782 Mixed hyperlipidemia: Secondary | ICD-10-CM | POA: Diagnosis not present

## 2023-06-15 DIAGNOSIS — Z79899 Other long term (current) drug therapy: Secondary | ICD-10-CM | POA: Diagnosis not present

## 2023-06-15 DIAGNOSIS — R7303 Prediabetes: Secondary | ICD-10-CM | POA: Diagnosis not present

## 2023-06-19 DIAGNOSIS — Z0000 Encounter for general adult medical examination without abnormal findings: Secondary | ICD-10-CM | POA: Diagnosis not present

## 2023-06-19 DIAGNOSIS — E782 Mixed hyperlipidemia: Secondary | ICD-10-CM | POA: Diagnosis not present

## 2023-06-19 DIAGNOSIS — Z1231 Encounter for screening mammogram for malignant neoplasm of breast: Secondary | ICD-10-CM | POA: Diagnosis not present

## 2023-06-19 DIAGNOSIS — Z79899 Other long term (current) drug therapy: Secondary | ICD-10-CM | POA: Diagnosis not present

## 2023-06-19 DIAGNOSIS — R7303 Prediabetes: Secondary | ICD-10-CM | POA: Diagnosis not present

## 2023-06-19 DIAGNOSIS — I10 Essential (primary) hypertension: Secondary | ICD-10-CM | POA: Diagnosis not present

## 2023-06-19 DIAGNOSIS — E559 Vitamin D deficiency, unspecified: Secondary | ICD-10-CM | POA: Diagnosis not present

## 2023-06-19 DIAGNOSIS — E039 Hypothyroidism, unspecified: Secondary | ICD-10-CM | POA: Diagnosis not present

## 2023-06-20 ENCOUNTER — Other Ambulatory Visit: Payer: Self-pay | Admitting: Internal Medicine

## 2023-06-20 DIAGNOSIS — Z1231 Encounter for screening mammogram for malignant neoplasm of breast: Secondary | ICD-10-CM

## 2023-07-04 ENCOUNTER — Ambulatory Visit
Admission: RE | Admit: 2023-07-04 | Discharge: 2023-07-04 | Disposition: A | Discharge status: Home / Self Care | Payer: No Typology Code available for payment source | Source: Ambulatory Visit | Attending: Internal Medicine | Admitting: Internal Medicine

## 2023-07-04 DIAGNOSIS — Z1231 Encounter for screening mammogram for malignant neoplasm of breast: Secondary | ICD-10-CM | POA: Diagnosis not present

## 2023-07-11 DIAGNOSIS — Z1211 Encounter for screening for malignant neoplasm of colon: Secondary | ICD-10-CM | POA: Diagnosis not present

## 2023-08-31 DIAGNOSIS — H11002 Unspecified pterygium of left eye: Secondary | ICD-10-CM | POA: Diagnosis not present

## 2023-08-31 DIAGNOSIS — H52223 Regular astigmatism, bilateral: Secondary | ICD-10-CM | POA: Diagnosis not present

## 2023-08-31 DIAGNOSIS — H04123 Dry eye syndrome of bilateral lacrimal glands: Secondary | ICD-10-CM | POA: Diagnosis not present

## 2023-08-31 DIAGNOSIS — Z961 Presence of intraocular lens: Secondary | ICD-10-CM | POA: Diagnosis not present

## 2023-09-24 DIAGNOSIS — E039 Hypothyroidism, unspecified: Secondary | ICD-10-CM | POA: Diagnosis not present

## 2023-09-24 DIAGNOSIS — Z79899 Other long term (current) drug therapy: Secondary | ICD-10-CM | POA: Diagnosis not present

## 2023-09-24 DIAGNOSIS — R7303 Prediabetes: Secondary | ICD-10-CM | POA: Diagnosis not present

## 2023-09-24 DIAGNOSIS — I10 Essential (primary) hypertension: Secondary | ICD-10-CM | POA: Diagnosis not present

## 2023-09-24 DIAGNOSIS — E782 Mixed hyperlipidemia: Secondary | ICD-10-CM | POA: Diagnosis not present

## 2023-09-26 DIAGNOSIS — Z0100 Encounter for examination of eyes and vision without abnormal findings: Secondary | ICD-10-CM | POA: Diagnosis not present

## 2023-12-20 DIAGNOSIS — E782 Mixed hyperlipidemia: Secondary | ICD-10-CM | POA: Diagnosis not present

## 2023-12-20 DIAGNOSIS — I10 Essential (primary) hypertension: Secondary | ICD-10-CM | POA: Diagnosis not present

## 2023-12-20 DIAGNOSIS — Z79899 Other long term (current) drug therapy: Secondary | ICD-10-CM | POA: Diagnosis not present

## 2023-12-20 DIAGNOSIS — E039 Hypothyroidism, unspecified: Secondary | ICD-10-CM | POA: Diagnosis not present

## 2023-12-20 DIAGNOSIS — R7303 Prediabetes: Secondary | ICD-10-CM | POA: Diagnosis not present

## 2024-07-03 ENCOUNTER — Other Ambulatory Visit: Payer: Self-pay | Admitting: Internal Medicine

## 2024-07-03 DIAGNOSIS — Z1331 Encounter for screening for depression: Secondary | ICD-10-CM | POA: Diagnosis not present

## 2024-07-03 DIAGNOSIS — E039 Hypothyroidism, unspecified: Secondary | ICD-10-CM | POA: Diagnosis not present

## 2024-07-03 DIAGNOSIS — Z1211 Encounter for screening for malignant neoplasm of colon: Secondary | ICD-10-CM | POA: Diagnosis not present

## 2024-07-03 DIAGNOSIS — Z0000 Encounter for general adult medical examination without abnormal findings: Secondary | ICD-10-CM | POA: Diagnosis not present

## 2024-07-03 DIAGNOSIS — R7303 Prediabetes: Secondary | ICD-10-CM | POA: Diagnosis not present

## 2024-07-03 DIAGNOSIS — Z1231 Encounter for screening mammogram for malignant neoplasm of breast: Secondary | ICD-10-CM | POA: Diagnosis not present

## 2024-07-03 DIAGNOSIS — Z79899 Other long term (current) drug therapy: Secondary | ICD-10-CM | POA: Diagnosis not present

## 2024-07-03 DIAGNOSIS — E782 Mixed hyperlipidemia: Secondary | ICD-10-CM | POA: Diagnosis not present

## 2024-07-03 DIAGNOSIS — D509 Iron deficiency anemia, unspecified: Secondary | ICD-10-CM | POA: Diagnosis not present

## 2024-07-03 DIAGNOSIS — E559 Vitamin D deficiency, unspecified: Secondary | ICD-10-CM | POA: Diagnosis not present

## 2024-07-03 DIAGNOSIS — E66812 Obesity, class 2: Secondary | ICD-10-CM | POA: Diagnosis not present

## 2024-07-03 DIAGNOSIS — I10 Essential (primary) hypertension: Secondary | ICD-10-CM | POA: Diagnosis not present

## 2024-07-07 ENCOUNTER — Other Ambulatory Visit: Payer: Self-pay | Admitting: Internal Medicine

## 2024-07-07 DIAGNOSIS — R519 Headache, unspecified: Secondary | ICD-10-CM

## 2024-07-10 ENCOUNTER — Ambulatory Visit
Admission: RE | Admit: 2024-07-10 | Discharge: 2024-07-10 | Disposition: A | Discharge status: Home / Self Care | Source: Ambulatory Visit | Attending: Internal Medicine | Admitting: Internal Medicine

## 2024-07-10 DIAGNOSIS — R519 Headache, unspecified: Secondary | ICD-10-CM | POA: Insufficient documentation

## 2024-07-10 DIAGNOSIS — J341 Cyst and mucocele of nose and nasal sinus: Secondary | ICD-10-CM | POA: Diagnosis not present

## 2024-07-17 DIAGNOSIS — I10 Essential (primary) hypertension: Secondary | ICD-10-CM | POA: Diagnosis not present

## 2024-07-17 DIAGNOSIS — Z1211 Encounter for screening for malignant neoplasm of colon: Secondary | ICD-10-CM | POA: Diagnosis not present

## 2024-07-17 DIAGNOSIS — R7303 Prediabetes: Secondary | ICD-10-CM | POA: Diagnosis not present

## 2024-07-17 DIAGNOSIS — Z79899 Other long term (current) drug therapy: Secondary | ICD-10-CM | POA: Diagnosis not present

## 2024-07-17 DIAGNOSIS — E782 Mixed hyperlipidemia: Secondary | ICD-10-CM | POA: Diagnosis not present

## 2024-07-17 DIAGNOSIS — E039 Hypothyroidism, unspecified: Secondary | ICD-10-CM | POA: Diagnosis not present

## 2024-07-22 ENCOUNTER — Ambulatory Visit
Admission: RE | Admit: 2024-07-22 | Discharge: 2024-07-22 | Disposition: A | Discharge status: Home / Self Care | Source: Ambulatory Visit | Attending: Internal Medicine | Admitting: Internal Medicine

## 2024-07-22 DIAGNOSIS — Z1231 Encounter for screening mammogram for malignant neoplasm of breast: Secondary | ICD-10-CM | POA: Diagnosis not present

## 2024-07-29 DIAGNOSIS — Z6837 Body mass index (BMI) 37.0-37.9, adult: Secondary | ICD-10-CM | POA: Diagnosis not present

## 2024-07-29 DIAGNOSIS — Z79899 Other long term (current) drug therapy: Secondary | ICD-10-CM | POA: Diagnosis not present

## 2024-07-29 DIAGNOSIS — E782 Mixed hyperlipidemia: Secondary | ICD-10-CM | POA: Diagnosis not present

## 2024-07-29 DIAGNOSIS — E66812 Obesity, class 2: Secondary | ICD-10-CM | POA: Diagnosis not present

## 2024-07-29 DIAGNOSIS — I10 Essential (primary) hypertension: Secondary | ICD-10-CM | POA: Diagnosis not present

## 2024-07-29 DIAGNOSIS — E039 Hypothyroidism, unspecified: Secondary | ICD-10-CM | POA: Diagnosis not present

## 2024-07-29 DIAGNOSIS — G43909 Migraine, unspecified, not intractable, without status migrainosus: Secondary | ICD-10-CM | POA: Diagnosis not present

## 2024-07-29 DIAGNOSIS — R7303 Prediabetes: Secondary | ICD-10-CM | POA: Diagnosis not present

## 2024-08-12 DIAGNOSIS — Z008 Encounter for other general examination: Secondary | ICD-10-CM | POA: Diagnosis not present

## 2024-08-12 DIAGNOSIS — E1169 Type 2 diabetes mellitus with other specified complication: Secondary | ICD-10-CM | POA: Diagnosis not present

## 2024-08-12 DIAGNOSIS — F17211 Nicotine dependence, cigarettes, in remission: Secondary | ICD-10-CM | POA: Diagnosis not present

## 2024-08-12 DIAGNOSIS — L409 Psoriasis, unspecified: Secondary | ICD-10-CM | POA: Diagnosis not present

## 2024-08-12 DIAGNOSIS — E039 Hypothyroidism, unspecified: Secondary | ICD-10-CM | POA: Diagnosis not present

## 2024-08-12 DIAGNOSIS — Z6835 Body mass index (BMI) 35.0-35.9, adult: Secondary | ICD-10-CM | POA: Diagnosis not present

## 2024-08-12 DIAGNOSIS — I10 Essential (primary) hypertension: Secondary | ICD-10-CM | POA: Diagnosis not present

## 2024-08-12 DIAGNOSIS — E785 Hyperlipidemia, unspecified: Secondary | ICD-10-CM | POA: Diagnosis not present

## 2024-08-20 DIAGNOSIS — L718 Other rosacea: Secondary | ICD-10-CM | POA: Diagnosis not present

## 2024-08-20 DIAGNOSIS — H35712 Central serous chorioretinopathy, left eye: Secondary | ICD-10-CM | POA: Diagnosis not present

## 2024-08-20 DIAGNOSIS — I10 Essential (primary) hypertension: Secondary | ICD-10-CM | POA: Diagnosis not present

## 2024-08-20 DIAGNOSIS — H0288A Meibomian gland dysfunction right eye, upper and lower eyelids: Secondary | ICD-10-CM | POA: Diagnosis not present

## 2024-08-20 DIAGNOSIS — H35033 Hypertensive retinopathy, bilateral: Secondary | ICD-10-CM | POA: Diagnosis not present

## 2024-08-20 DIAGNOSIS — H0288B Meibomian gland dysfunction left eye, upper and lower eyelids: Secondary | ICD-10-CM | POA: Diagnosis not present

## 2024-08-20 DIAGNOSIS — H43813 Vitreous degeneration, bilateral: Secondary | ICD-10-CM | POA: Diagnosis not present

## 2024-08-20 DIAGNOSIS — Z961 Presence of intraocular lens: Secondary | ICD-10-CM | POA: Diagnosis not present

## 2024-08-20 DIAGNOSIS — H16223 Keratoconjunctivitis sicca, not specified as Sjogren's, bilateral: Secondary | ICD-10-CM | POA: Diagnosis not present

## 2024-09-12 DIAGNOSIS — H35712 Central serous chorioretinopathy, left eye: Secondary | ICD-10-CM | POA: Diagnosis not present

## 2024-09-12 DIAGNOSIS — H43813 Vitreous degeneration, bilateral: Secondary | ICD-10-CM | POA: Diagnosis not present
